# Patient Record
Sex: Female | Born: 1965 | Race: White | Hispanic: No | Marital: Married | Smoking: Never smoker
Health system: Southern US, Community
[De-identification: ages and names within clinical notes are randomized; demographics above are authoritative.]

## PROBLEM LIST (undated history)

## (undated) DIAGNOSIS — K8020 Calculus of gallbladder without cholecystitis without obstruction: Secondary | ICD-10-CM

## (undated) DIAGNOSIS — C8190 Hodgkin lymphoma, unspecified, unspecified site: Secondary | ICD-10-CM

## (undated) DIAGNOSIS — E785 Hyperlipidemia, unspecified: Secondary | ICD-10-CM

## (undated) DIAGNOSIS — K529 Noninfective gastroenteritis and colitis, unspecified: Secondary | ICD-10-CM

## (undated) HISTORY — PX: ABDOMINAL HYSTERECTOMY: SHX81

## (undated) HISTORY — PX: OTHER SURGICAL HISTORY: SHX169

## (undated) HISTORY — DX: Noninfective gastroenteritis and colitis, unspecified: K52.9

## (undated) HISTORY — PX: LYMPH NODE BIOPSY: SHX201

## (undated) HISTORY — DX: Calculus of gallbladder without cholecystitis without obstruction: K80.20

## (undated) HISTORY — DX: Hodgkin lymphoma, unspecified, unspecified site: C81.90

## (undated) HISTORY — DX: Hyperlipidemia, unspecified: E78.5

## (undated) HISTORY — PX: TONSILECTOMY, ADENOIDECTOMY, BILATERAL MYRINGOTOMY AND TUBES: SHX2538

---

## 1997-01-03 DIAGNOSIS — C8190 Hodgkin lymphoma, unspecified, unspecified site: Secondary | ICD-10-CM

## 1997-01-03 HISTORY — DX: Hodgkin lymphoma, unspecified, unspecified site: C81.90

## 1997-01-03 HISTORY — PX: OTHER SURGICAL HISTORY: SHX169

## 1997-05-01 ENCOUNTER — Ambulatory Visit (HOSPITAL_COMMUNITY): Admission: RE | Admit: 1997-05-01 | Discharge: 1997-05-01 | Payer: Self-pay | Admitting: *Deleted

## 1997-05-21 ENCOUNTER — Ambulatory Visit (HOSPITAL_COMMUNITY): Admission: RE | Admit: 1997-05-21 | Discharge: 1997-05-21 | Payer: Self-pay | Admitting: Oncology

## 1997-05-29 ENCOUNTER — Ambulatory Visit (HOSPITAL_COMMUNITY): Admission: RE | Admit: 1997-05-29 | Discharge: 1997-05-29 | Payer: Self-pay | Admitting: Oncology

## 1997-06-03 ENCOUNTER — Ambulatory Visit (HOSPITAL_COMMUNITY): Admission: RE | Admit: 1997-06-03 | Discharge: 1997-06-03 | Payer: Self-pay | Admitting: Oncology

## 1997-09-29 ENCOUNTER — Ambulatory Visit (HOSPITAL_COMMUNITY): Admission: RE | Admit: 1997-09-29 | Discharge: 1997-09-29 | Payer: Self-pay | Admitting: Oncology

## 1997-10-03 ENCOUNTER — Encounter: Payer: Self-pay | Admitting: Oncology

## 1997-10-03 ENCOUNTER — Ambulatory Visit (HOSPITAL_COMMUNITY): Admission: RE | Admit: 1997-10-03 | Discharge: 1997-10-03 | Payer: Self-pay | Admitting: Oncology

## 1997-12-31 ENCOUNTER — Ambulatory Visit (HOSPITAL_BASED_OUTPATIENT_CLINIC_OR_DEPARTMENT_OTHER): Admission: RE | Admit: 1997-12-31 | Discharge: 1997-12-31 | Payer: Self-pay | Admitting: *Deleted

## 1998-10-22 ENCOUNTER — Other Ambulatory Visit: Admission: RE | Admit: 1998-10-22 | Discharge: 1998-10-22 | Payer: Self-pay | Admitting: *Deleted

## 1999-01-04 HISTORY — PX: LASIK: SHX215

## 2000-04-21 ENCOUNTER — Other Ambulatory Visit: Admission: RE | Admit: 2000-04-21 | Discharge: 2000-04-21 | Payer: Self-pay | Admitting: *Deleted

## 2002-04-02 ENCOUNTER — Other Ambulatory Visit: Admission: RE | Admit: 2002-04-02 | Discharge: 2002-04-02 | Payer: Self-pay | Admitting: Obstetrics & Gynecology

## 2003-04-15 ENCOUNTER — Other Ambulatory Visit: Admission: RE | Admit: 2003-04-15 | Discharge: 2003-04-15 | Payer: Self-pay | Admitting: Obstetrics & Gynecology

## 2004-11-03 ENCOUNTER — Ambulatory Visit: Payer: Self-pay | Admitting: Oncology

## 2005-01-03 HISTORY — PX: PARTIAL HYSTERECTOMY: SHX80

## 2005-05-04 ENCOUNTER — Encounter: Payer: Self-pay | Admitting: Oncology

## 2005-10-07 ENCOUNTER — Ambulatory Visit: Payer: Self-pay | Admitting: Oncology

## 2007-01-04 HISTORY — PX: OTHER SURGICAL HISTORY: SHX169

## 2009-01-03 HISTORY — PX: BREAST REDUCTION SURGERY: SHX8

## 2011-01-04 HISTORY — PX: OTHER SURGICAL HISTORY: SHX169

## 2012-10-23 ENCOUNTER — Encounter: Payer: Self-pay | Admitting: Internal Medicine

## 2012-12-10 ENCOUNTER — Encounter: Payer: Self-pay | Admitting: Internal Medicine

## 2012-12-11 ENCOUNTER — Ambulatory Visit (INDEPENDENT_AMBULATORY_CARE_PROVIDER_SITE_OTHER): Payer: BC Managed Care – PPO | Admitting: Internal Medicine

## 2012-12-11 ENCOUNTER — Encounter: Payer: Self-pay | Admitting: Internal Medicine

## 2012-12-11 ENCOUNTER — Other Ambulatory Visit (INDEPENDENT_AMBULATORY_CARE_PROVIDER_SITE_OTHER): Payer: BC Managed Care – PPO

## 2012-12-11 VITALS — BP 100/60 | HR 64 | Ht 62.25 in | Wt 134.4 lb

## 2012-12-11 DIAGNOSIS — C8190 Hodgkin lymphoma, unspecified, unspecified site: Secondary | ICD-10-CM | POA: Insufficient documentation

## 2012-12-11 DIAGNOSIS — R109 Unspecified abdominal pain: Secondary | ICD-10-CM

## 2012-12-11 LAB — COMPREHENSIVE METABOLIC PANEL
ALT: 12 U/L (ref 0–35)
AST: 17 U/L (ref 0–37)
Albumin: 4.4 g/dL (ref 3.5–5.2)
Alkaline Phosphatase: 74 U/L (ref 39–117)
Calcium: 9.7 mg/dL (ref 8.4–10.5)
Chloride: 100 mEq/L (ref 96–112)
Creatinine, Ser: 0.7 mg/dL (ref 0.4–1.2)
GFR: 88.65 mL/min (ref >60.00)
Glucose, Bld: 96 mg/dL (ref 70–99)
Total Bilirubin: 0.8 mg/dL (ref 0.3–1.2)
Total Protein: 8.1 g/dL (ref 6.0–8.3)

## 2012-12-11 LAB — SEDIMENTATION RATE: Sed Rate: 10 mm/hr (ref 0–22)

## 2012-12-11 LAB — CBC
Hemoglobin: 15 g/dL (ref 12.0–15.0)
MCHC: 34.1 g/dL (ref 30.0–36.0)
Platelets: 248 10*3/uL (ref 150.0–400.0)
RDW: 12.5 % (ref 11.5–14.6)

## 2012-12-11 LAB — TSH: TSH: 2.72 u[IU]/mL (ref 0.35–5.50)

## 2012-12-11 MED ORDER — DICYCLOMINE HCL 10 MG PO CAPS: 20.0000 mg | ORAL_CAPSULE | Freq: Three times a day (TID) | ORAL | Status: DC

## 2012-12-11 NOTE — Patient Instructions (Signed)
Your physician has requested that you go to the basement forlab work before leaving today.  We have sent the following medications to your pharmacy for you to pick up at your convenience: Bentyl.  Start taking benefiber 1-2 times daily  You have been scheduled for a colonoscopy/Endoscopy with propofol. Please follow written instructions given to you at your visit today.  Please pick up your prep kit at the pharmacy within the next 1-3 days. If you use inhalers (even only as needed), please bring them with you on the day of your procedure. Your physician has requested that you go to www.startemmi.com and enter the access code given to you at your visit today. This web site gives a general overview about your procedure. However, you should still follow specific instructions given to you by our office regarding your preparation for the procedure.

## 2012-12-11 NOTE — Progress Notes (Signed)
Patient ID: Yolanda [name], female   DOB: 03/16/1965, 47 y.o.   MRN: [id number] HPI: [name] is a 47 yo female with a past medical history of Hodgkin's disease in remission, hyperlipidemia, and left ovarian cyst status post left oophorectomy who is seen for evaluation of chronic left-sided abdominal pain. She is here alone today. Her surgical history is somewhat complex and dates back to Oct 2013 she had a left ovarian cyst removed. This surgery was complicated by a thermal injury to her left ureter requiring several ureteral stent placements and eventually robotic surgery in May 2014 for ureteral repair. She was also told during her initial surgery in October 2013 she had a "kink in her colon" and that it was "fixed" by her gynecologist.  She reports despite having been through all of the previously mentioned surgeries she feels that her left-sided abdominal pain may have been present the entire time even before the large left ovarian cyst was removed. She reports a near constant left-sided abdominal aching pain. This is 4-5/10 in intensity. She reports that it is better when she is distracted and working, but present whenever she is still or not dizzy. This is worse when she is constipated but doesn't get better once she is able to have a bowel movement. She reports a long history of constipation having a bowel movement every 4-5 days but over the last months her stools have been more frequent and more loose. She is now having a loose bowel movement about every other day. She reports this feels like a "expanding pressure" in her left midabdomen. It is also worse with overeating. She denies nausea or vomiting. No heartburn. She occasionally uses 2 over-the-counter naproxen which seems to help some and about once a week uses the Percocet. She reports it hurts to lie on her left side or on her abdomen. She denies blood in her stool or melena. No significant weight loss.  Of note she did see Dr. Tempie Donning at [facility] gynecologic and pelvic pain clinic - and was told that this pain was not necessarily pelvic in nature and that a GYN issue had been ruled out. It was his recommendation for her to have a GI consultation  Past Medical History  Diagnosis Date  . Hodgkin's disease 1999  . Gallstones   . Hyperlipidemia   . Colitis     Past Surgical History  Procedure Laterality Date  . Abdominal hysterectomy    . Tonsilectomy, adenoidectomy, bilateral myringotomy and tubes    . Lubualigation    . Lymph node biopsy    . Port placed  1999  . Port removed  1999  . Lasik  2001  . Partial hysterectomy  2007  . Tummy tuck  2009  . Incision revision  2009  . Breast reduction surgery  2011  . Left ovary /fallopian removed  2013    Current Outpatient Prescriptions  Medication Sig Dispense Refill  . Naproxen Sodium (ALEVE) 220 MG CAPS Take 2 capsules by mouth every morning.      Marland Kitchen oxyCODONE-acetaminophen (PERCOCET/ROXICET) 5-325 MG per tablet Take 1 tablet by mouth as needed for severe pain.      Marland Kitchen dicyclomine (BENTYL) 10 MG capsule Take 2 capsules (20 mg total) by mouth 4 (four) times daily -  before meals and at bedtime.  90 capsule  1   No current facility-administered medications for this visit.    Allergies  Allergen Reactions  . Codeine Itching  . Sulfa Antibiotics  Hives    Family History  Problem Relation Age of Onset  . Diabetes Father   . Heart disease Father   . Heart disease Mother   . Colon cancer Neg Hx     History  Substance Use Topics  . Smoking status: Never Smoker   . Smokeless tobacco: Never Used  . Alcohol Use: Yes    ROS: As per history of present illness, otherwise negative  BP 100/60  Pulse 64  Ht 5' 2.25" (1.581 m)  Wt 134 lb 6.4 oz (60.963 kg)  BMI 24.39 kg/m2 Constitutional: Well-developed and well-nourished. No distress. HEENT: Normocephalic and atraumatic. Oropharynx is clear and moist. No oropharyngeal exudate. Conjunctivae are normal.  No  scleral icterus. Neck: Neck supple. Trachea midline. Cardiovascular: Normal rate, regular rhythm and intact distal pulses. No M/R/G Pulmonary/chest: Effort normal and breath sounds normal. No wheezing, rales or rhonchi. Abdominal: Multiple well-healed abdominal scars, Soft, nontender, nondistended. Bowel sounds active throughout. There are no masses palpable.  Extremities: no clubbing, cyanosis, or edema Lymphadenopathy: No cervical adenopathy noted. Neurological: Alert and oriented to person place and time. Skin: Skin is warm and dry. No rashes noted. Psychiatric: Normal mood and affect. Behavior is normal.  ASSESSMENT/PLAN: 47 yo female with a past medical history of Hodgkin's disease in remission, hyperlipidemia, and left ovarian cyst status post left oophorectomy who is seen for evaluation of chronic left-sided abdominal pain.   1.  Chronic, left-sided abd pain -- she has had ovarian surgery complicated by ureteral injury and multiple urologic interventions over the last year, though none of them have led to significant improvement in her now chronic left-sided abdominal pain. My initial inclination is that this may be functional in nature, though I feel it is important to rule out anatomic abnormalities. I have recommended colonoscopy for direct visualization of the colon. This will also help rule out an inflammatory process such as IBD. We will also perform an upper endoscopy on the same day to rule out ulcer disease or significant upper GI inflammation.  We entertained the possibility of an internal hernia given her multiple previous abdominal surgeries, though these are sometimes hard to prove. We will initially start with endoscopies and consider small bowel follow-through thereafter if deemed appropriate. I will also check CMP, CBC, celiac panel, TSH and ESR today. I recommended that she begin Benefiber 1-2 times daily to help bulk her stools and also establish more regular bowel pattern. I  will give her a prescription for Bentyl 20 mg to be used 3 times daily to see if this helps with her discomfort. She has had cross-sectional imaging in the past, though no recent CT is available in our system.  Further recommendations after labs and procedures

## 2012-12-12 ENCOUNTER — Encounter: Payer: Self-pay | Admitting: Internal Medicine

## 2012-12-17 ENCOUNTER — Telehealth: Payer: Self-pay | Admitting: Internal Medicine

## 2012-12-18 MED ORDER — MOVIPREP 100 G PO SOLR: ORAL | Status: DC

## 2012-12-18 NOTE — Telephone Encounter (Signed)
Sent moviprep to pt's pharamcy

## 2013-01-09 ENCOUNTER — Ambulatory Visit (AMBULATORY_SURGERY_CENTER): Payer: BC Managed Care – PPO | Admitting: Internal Medicine

## 2013-01-09 ENCOUNTER — Encounter: Payer: Self-pay | Admitting: Internal Medicine

## 2013-01-09 ENCOUNTER — Ambulatory Visit: Payer: Self-pay | Admitting: Internal Medicine

## 2013-01-09 VITALS — BP 117/69 | HR 68 | Temp 97.9°F | Resp 13 | Ht 62.0 in | Wt 134.0 lb

## 2013-01-09 DIAGNOSIS — D126 Benign neoplasm of colon, unspecified: Secondary | ICD-10-CM

## 2013-01-09 DIAGNOSIS — R109 Unspecified abdominal pain: Secondary | ICD-10-CM

## 2013-01-09 DIAGNOSIS — K209 Esophagitis, unspecified without bleeding: Secondary | ICD-10-CM

## 2013-01-09 DIAGNOSIS — K229 Disease of esophagus, unspecified: Secondary | ICD-10-CM

## 2013-01-09 MED ORDER — SODIUM CHLORIDE 0.9 % IV SOLN: 500.0000 mL | INTRAVENOUS | Status: DC

## 2013-01-09 MED ORDER — TRAMADOL HCL 50 MG PO TABS: 50.0000 mg | ORAL_TABLET | Freq: Four times a day (QID) | ORAL | Status: DC | PRN

## 2013-01-09 NOTE — Patient Instructions (Signed)
Impressions/recommendations:  Endoscopy  Await biopsy results.  Colonoscopy  Polyp (handout given) Diverticulosis (handout given)  YOU HAD AN ENDOSCOPIC PROCEDURE TODAY AT [facility]: Refer to the procedure report that was given to you for any specific questions about what was found during the examination.  If the procedure report does not answer your questions, please call your gastroenterologist to clarify.  If you requested that your care partner not be given the details of your procedure findings, then the procedure report has been included in a sealed envelope for you to review at your convenience later.  YOU SHOULD EXPECT: Some feelings of bloating in the abdomen. Passage of more gas than usual.  Walking can help get rid of the air that was put into your GI tract during the procedure and reduce the bloating. If you had a lower endoscopy (such as a colonoscopy or flexible sigmoidoscopy) you may notice spotting of blood in your stool or on the toilet paper. If you underwent a bowel prep for your procedure, then you may not have a normal bowel movement for a few days.  DIET: Your first meal following the procedure should be a light meal and then it is ok to progress to your normal diet.  A half-sandwich or bowl of soup is an example of a good first meal.  Heavy or fried foods are harder to digest and may make you feel nauseous or bloated.  Likewise meals heavy in dairy and vegetables can cause extra gas to form and this can also increase the bloating.  Drink plenty of fluids but you should avoid alcoholic beverages for 24 hours.  ACTIVITY: Your care partner should take you home directly after the procedure.  You should plan to take it easy, moving slowly for the rest of the day.  You can resume normal activity the day after the procedure however you should NOT DRIVE or use heavy machinery for 24 hours (because of the sedation medicines used during the test).    SYMPTOMS TO  REPORT IMMEDIATELY: A gastroenterologist can be reached at any hour.  During normal business hours, 8:30 AM to 5:00 PM Monday through Friday, call [phone].  After hours and on weekends, please call the GI answering service at [phone] who will take a message and have the physician on call contact you.   Following lower endoscopy (colonoscopy or flexible sigmoidoscopy):  Excessive amounts of blood in the stool  Significant tenderness or worsening of abdominal pains  Swelling of the abdomen that is new, acute  Fever of 100F or higher  Following upper endoscopy (EGD)  Vomiting of blood or coffee ground material  New chest pain or pain under the shoulder blades  Painful or persistently difficult swallowing  New shortness of breath  Fever of 100F or higher  Black, tarry-looking stools  FOLLOW UP: If any biopsies were taken you will be contacted by phone or by letter within the next 1-3 weeks.  Call your gastroenterologist if you have not heard about the biopsies in 3 weeks.  Our staff will call the home number listed on your records the next business day following your procedure to check on you and address any questions or concerns that you may have at that time regarding the information given to you following your procedure. This is a courtesy call and so if there is no answer at the home number and we have not heard from you through the emergency physician on call, we will assume  that you have returned to your regular daily activities without incident.  SIGNATURES/CONFIDENTIALITY: You and/or your care partner have signed paperwork which will be entered into your electronic medical record.  These signatures attest to the fact that that the information above on your After Visit Summary has been reviewed and is understood.  Full responsibility of the confidentiality of this discharge information lies with you and/or your care-partner.

## 2013-01-09 NOTE — Progress Notes (Signed)
Called to room to assist during endoscopic procedure.  Patient ID and intended procedure confirmed with present staff. Received instructions for my participation in the procedure from the performing physician.  

## 2013-01-09 NOTE — Progress Notes (Signed)
Lidocaine-40mg IV prior to Propofol InductionPropofol given over incremental dosages 

## 2013-01-09 NOTE — Op Note (Signed)
[place]  [address] [place], [address]   ENDOSCOPY PROCEDURE REPORT  PATIENT: Yolanda, [name]  MR#: [id number] BIRTHDATE: 07-Oct-1965 , 47  yrs. old GENDER: Female ENDOSCOPIST: Jerene Bears, MD PROCEDURE DATE:  01/09/2013 PROCEDURE:  EGD w/ biopsy ASA CLASS:     Class II INDICATIONS:  left-sided abdominal pain. MEDICATIONS: MAC sedation, administered by CRNA and propofol (Diprivan) 200mg  IV TOPICAL ANESTHETIC: Cetacaine Spray  DESCRIPTION OF PROCEDURE: After the risks benefits and alternatives of the procedure were thoroughly explained, informed consent was obtained.  The LB [id number] endoscope was introduced through the mouth and advanced to the second portion of the duodenum. Without limitations.  The instrument was slowly withdrawn as the mucosa was fully examined.    ESOPHAGUS: There was a 1 cm island of possible Barrett's esophagus in the lower third of the esophagus, at 38 cm.  Multiple biopsies were performed using cold forceps.  Sample obtained to rule out Barrett's esophagus (If positive, C0M1).   The esophagus was otherwise normal.  STOMACH: The mucosa of the stomach appeared normal.  DUODENUM: The duodenal mucosa showed no abnormalities in the bulb and second portion of the duodenum. Retroflexed views revealed no abnormalities.     The scope was then withdrawn from the patient and the procedure completed.  COMPLICATIONS: There were no complications. ENDOSCOPIC IMPRESSION: 1.   There was evidence of possible Barrett's esophagus; multiple biopsies 2.   The esophagus was otherwise normal. 3.   The mucosa of the stomach appeared normal 4.   The duodenal mucosa showed no abnormalities in the bulb and second portion of the duodenum  RECOMMENDATIONS: 1.  Await pathology results 2.  Colonoscopy today   eSigned:  Jerene Bears, MD 01/09/2013 4:27 PM CC:The Patient and Cecille Amsterdam, MD

## 2013-01-09 NOTE — Op Note (Signed)
[place]  [address]. [place], [address]   COLONOSCOPY PROCEDURE REPORT  PATIENT: Yolanda [name], Yolanda [name]  MR#: [id number] BIRTHDATE: April 24, 1965 , 47  yrs. old GENDER: Female ENDOSCOPIST: Jerene Bears, MD PROCEDURE DATE:  01/09/2013 PROCEDURE:   Colonoscopy with snare polypectomy First Screening Colonoscopy - Avg.  risk and is 50 yrs.  old or older - No.  Prior Negative Screening - Now for repeat screening. N/A  History of Adenoma - Now for follow-up colonoscopy & has been > or = to 3 yrs.  N/A  Polyps Removed Today? Yes. ASA CLASS:   Class II INDICATIONS:chronic left-sided abdominal pain, screening. MEDICATIONS: MAC sedation, administered by CRNA and propofol (Diprivan) 200mg  IV  DESCRIPTION OF PROCEDURE:   After the risks benefits and alternatives of the procedure were thoroughly explained, informed consent was obtained.  A digital rectal exam revealed no rectal mass.   The LB [id number]  endoscope was introduced through the anus and advanced to the cecum, which was identified by both the appendix and ileocecal valve. No adverse events experienced. The quality of the prep was good, using MoviPrep  The instrument was then slowly withdrawn as the colon was fully examined.   COLON FINDINGS: A sessile polyp measuring 4 mm in size was found at the hepatic flexure.  A polypectomy was performed with a cold snare.  The resection was complete and the polyp tissue was completely retrieved.   There was mild scattered diverticulosis noted in the ascending colon and sigmoid colon.   The colon mucosa was otherwise normal.  Retroflexed views revealed no abnormalities. The time to cecum=4 minutes 03 seconds.  Withdrawal time=12 minutes 00 seconds.  The scope was withdrawn and the procedure completed. COMPLICATIONS: There were no complications.  ENDOSCOPIC IMPRESSION: 1.   Sessile polyp measuring 4 mm in size was found at the hepatic flexure; polypectomy was performed  with a cold snare 2.   There was mild diverticulosis noted in the ascending colon and sigmoid colon 3.   The colon mucosa was otherwise normal without explanation of left-sided abdominal pain  RECOMMENDATIONS: 1.  Await pathology results 2.  High fiber diet 3.  If the polyp removed today is proven to be an adenomatous (pre-cancerous) polyp, you will need a repeat colonoscopy in 5 years.  Otherwise you should continue to follow colorectal cancer screening guidelines for "routine risk" patients with colonoscopy in 10 years.  You will receive a letter within 1-2 weeks with the results of your biopsy as well as final recommendations.  Please call my office if you have not received a letter after 3 weeks.   eSigned:  Jerene Bears, MD 01/09/2013 4:31 PM cc: The Patient and Cecille Amsterdam, MD

## 2013-01-10 ENCOUNTER — Telehealth: Payer: Self-pay | Admitting: *Deleted

## 2013-01-10 NOTE — Telephone Encounter (Signed)
  Follow up Call-  Call back number 01/09/2013  Post procedure Call Back phone  # [phone]  Permission to leave phone message Yes     Patient questions:  Do you have a fever, pain , or abdominal swelling? no Pain Score  0 *  Have you tolerated food without any problems? yes  Have you been able to return to your normal activities? yes  Do you have any questions about your discharge instructions: Diet   no Medications  no Follow up visit  no  Do you have questions or concerns about your Care? no  Actions: * If pain score is 4 or above: No action needed, pain <4.

## 2013-01-11 ENCOUNTER — Telehealth: Payer: Self-pay | Admitting: *Deleted

## 2013-01-11 DIAGNOSIS — Z90710 Acquired absence of both cervix and uterus: Secondary | ICD-10-CM

## 2013-01-11 DIAGNOSIS — G8929 Other chronic pain: Secondary | ICD-10-CM

## 2013-01-11 DIAGNOSIS — N9981 Other intraoperative complications of genitourinary system: Secondary | ICD-10-CM

## 2013-01-11 DIAGNOSIS — R1032 Left lower quadrant pain: Principal | ICD-10-CM

## 2013-01-11 NOTE — Telephone Encounter (Signed)
Pt will have her CT on 01/14/13 at 10am. Contrast and  instructions are at the front desk. Pt stated understanding.

## 2013-01-11 NOTE — Telephone Encounter (Signed)
Message copied by Lance Morin on Fri Jan 11, 2013  8:43 AM ------      Message from: Jerene Bears      Created: Wed Jan 09, 2013  5:08 PM      Regarding: Radiology request       CT abd/pelvis with contrast      Chronic left-sided abd pain, hx of hysterectomy with left ureteral injury, s/p repair      JMP       ------

## 2013-01-14 ENCOUNTER — Ambulatory Visit (INDEPENDENT_AMBULATORY_CARE_PROVIDER_SITE_OTHER)
Admission: RE | Admit: 2013-01-14 | Discharge: 2013-01-14 | Disposition: A | Discharge status: Home / Self Care | Payer: BC Managed Care – PPO | Source: Ambulatory Visit | Attending: Internal Medicine | Admitting: Internal Medicine

## 2013-01-14 DIAGNOSIS — G8929 Other chronic pain: Secondary | ICD-10-CM

## 2013-01-14 DIAGNOSIS — Z90710 Acquired absence of both cervix and uterus: Secondary | ICD-10-CM

## 2013-01-14 DIAGNOSIS — R1032 Left lower quadrant pain: Secondary | ICD-10-CM

## 2013-01-14 DIAGNOSIS — N9981 Other intraoperative complications of genitourinary system: Secondary | ICD-10-CM

## 2013-01-14 MED ORDER — IOHEXOL 300 MG/ML  SOLN
100.0000 mL | Freq: Once | INTRAMUSCULAR | Status: AC | PRN
  Administered 2013-01-14: 100 mL via INTRAVENOUS

## 2013-01-15 ENCOUNTER — Other Ambulatory Visit: Payer: BC Managed Care – PPO

## 2013-01-16 ENCOUNTER — Encounter: Payer: Self-pay | Admitting: Internal Medicine

## 2013-01-17 ENCOUNTER — Telehealth: Payer: Self-pay | Admitting: Internal Medicine

## 2013-01-17 NOTE — Telephone Encounter (Signed)
After several attempts at several numbers, the scheduling number for Dr Darlys Gales is [phone]. The fax number is [phone]. The ofc requested I fax the info and Dr Peterson Ao will decide if pt should see him or another specialist at his facility. lmom informed pt of this; she may call back.

## 2013-01-18 NOTE — Telephone Encounter (Signed)
She should see me after OBGYN

## 2013-01-18 NOTE — Telephone Encounter (Signed)
Informed pt I faxed over her ofc info and Dr Peterson Ao will review and his ofc will call her back. Pt states they have left a message she wanted to know what I found out. Wished her luck.

## 2013-01-18 NOTE — Telephone Encounter (Signed)
lmom for pt to call back

## 2013-01-21 NOTE — Telephone Encounter (Signed)
Mailed pt a letter requesting she call for a f/u after she is seen at [facility].

## 2013-02-05 ENCOUNTER — Telehealth: Payer: Self-pay | Admitting: Internal Medicine

## 2013-02-05 DIAGNOSIS — R1032 Left lower quadrant pain: Secondary | ICD-10-CM

## 2013-02-05 NOTE — Telephone Encounter (Signed)
Pt called earlier and left me a message; she saw Dr Tommye Standard at [facility]  And he reviewed her CT scan, and her problem is not GYN related. She has a cervix and that's what was seen on the CT. The doctor did do a pelvic exam that was normal and her cervix was checked also. The doctor could not think of anyone that he could refer her to. Pt would like to have an MRI if you will order it. The earliest f/u I could give her is 03/05/13. Please advise. Thanks.

## 2013-02-11 NOTE — Telephone Encounter (Signed)
Pt called in to see if Dr Hilarie Fredrickson decided on an MRI or not.

## 2013-02-14 NOTE — Telephone Encounter (Signed)
lmom for pt to call back

## 2013-02-14 NOTE — Telephone Encounter (Signed)
Okay for pelvic MRI to followup previous CT scan abnormality

## 2013-02-14 NOTE — Telephone Encounter (Signed)
Pt stated she was willing to have an MRI anytime. She is scheduled for 02/16/14, arrive at 0800am at [address], no other prep. Pt stated understanding.

## 2013-02-16 ENCOUNTER — Ambulatory Visit
Admission: RE | Admit: 2013-02-16 | Discharge: 2013-02-16 | Disposition: A | Discharge status: Home / Self Care | Payer: BC Managed Care – PPO | Source: Ambulatory Visit | Attending: Internal Medicine | Admitting: Internal Medicine

## 2013-02-16 DIAGNOSIS — R1032 Left lower quadrant pain: Secondary | ICD-10-CM

## 2013-02-16 MED ORDER — GADOBENATE DIMEGLUMINE 529 MG/ML IV SOLN
12.0000 mL | Freq: Once | INTRAVENOUS | Status: AC | PRN
  Administered 2013-02-16: 12 mL via INTRAVENOUS

## 2013-02-21 ENCOUNTER — Telehealth: Payer: Self-pay | Admitting: Internal Medicine

## 2013-02-21 NOTE — Telephone Encounter (Signed)
Please advise on results of MRI and next step

## 2013-02-21 NOTE — Telephone Encounter (Signed)
See result note.  

## 2013-02-21 NOTE — Telephone Encounter (Signed)
See results note for documentation on MRI

## 2013-03-04 ENCOUNTER — Encounter: Payer: Self-pay | Admitting: Internal Medicine

## 2013-03-05 ENCOUNTER — Ambulatory Visit (INDEPENDENT_AMBULATORY_CARE_PROVIDER_SITE_OTHER): Payer: BC Managed Care – PPO | Admitting: Internal Medicine

## 2013-03-05 ENCOUNTER — Encounter: Payer: Self-pay | Admitting: Internal Medicine

## 2013-03-05 VITALS — BP 108/66 | HR 68 | Ht 62.75 in | Wt 134.2 lb

## 2013-03-05 DIAGNOSIS — R1032 Left lower quadrant pain: Secondary | ICD-10-CM

## 2013-03-05 DIAGNOSIS — G8929 Other chronic pain: Secondary | ICD-10-CM

## 2013-03-05 MED ORDER — AMITRIPTYLINE HCL 25 MG PO TABS: 25.0000 mg | ORAL_TABLET | Freq: Every day | ORAL | Status: DC

## 2013-03-05 NOTE — Patient Instructions (Signed)
We have sent the following medications to your pharmacy for you to pick up at your convenience: amitriptyline   You have been referred to [facility] located at [place], [place] [phone] they will contact you to set up an appointment.   Follow up with your urologist and please have him fax [place] any office visit notes.

## 2013-03-05 NOTE — Progress Notes (Signed)
Subjective:    Patient ID: Yolanda [name], female    DOB: 09/16/1965, 48 y.o.   MRN: [id number]  HPI Yolanda [name] is a 48 year old female with a past medical history of Hodgkin's disease in remission, hyperlipidemia, left ovarian cyst status post oophorectomy complicated by left ureteral injury requiring several urologic surgeries who is seen in followup for chronic left-sided abdominal pain. She is here alone today. She was initially seen in December 2014 and then had an upper endoscopy and colonoscopy performed on 01/09/2013. Her colonoscopy showed a 4 mm sessile polyp at the hepatic flexure. This was removed and found to not be adenomatous. There was mild diverticulosis in the ascending and sigmoid colon the colonic mucosa was otherwise normal. No findings to explain left-sided abdominal pain. Upper endoscopy performed on the same day showed possible Barrett's esophagus and was otherwise normal. Biopsies were negative for metaplasia and did show mild inflammation felt to be secondary to reflux. No dysplasia. After her endoscopy she had a CT scan and MRI pelvis and has followed up with gynecology at [facility]. She was found to have right ovarian cyst felt to be benign and the CT abnormality near the vaginal cuff was confirmed by pelvic MRI to be cervix which was not removed at the time of her hysterectomy. Her gynecologist did not feel her left-sided abdominal pain was gynecologic in etiology.  The pain is unchanged there may be a bit worse than when it first started. This feels similar to the pain she had before her oophorectomy in October 2013. It is constant in nature and unchanging though it does worsen just before bowel movement. It is located in her left abdomen. There is a distractibility component in that it is less noticeable when she is working. Otherwise it is constant around 5-6/10. It does not change with eating and is not improve after bowel movement. She denies diarrhea or  constipation, no blood in her stool or melena.  She is hesitant for chronic pain clinic because she feels this has a stigma associated with it. She does plan to followup with Dr. Estill Dooms her urologist in [facility] repeat IVP study  Review of Systems As per history of present illness, otherwise negative  Current Medications, Allergies, Past Medical History, Past Surgical History, Family History and Social History were reviewed in [employer] record.     Objective:   Physical Exam BP 108/66  Pulse 68  Ht 5' 2.75" (1.594 m)  Wt 134 lb 3.2 oz (60.873 kg)  BMI 23.96 kg/m2 Constitutional: Well-developed and well-nourished. No distress. HEENT: Normocephalic and atraumatic.  No scleral icterus. Cardiovascular: Normal rate, regular rhythm  Pulmonary/chest: Effort normal and breath sounds normal.  Abdominal: Soft, nontender, nondistended. Bowel sounds active throughout. There are no masses palpable.  Extremities: no clubbing, cyanosis, or edema Neurological: Alert and oriented to person place and time. Psychiatric: Normal mood and affect. Behavior is normal.  CBC    Component Value Date/Time   WBC 6.2 12/11/2012 1117   RBC 4.82 12/11/2012 1117   HGB 15.0 12/11/2012 1117   HCT 44.1 12/11/2012 1117   PLT 248.0 12/11/2012 1117   MCV 91.5 12/11/2012 1117   MCHC 34.1 12/11/2012 1117   RDW 12.5 12/11/2012 1117    CMP     Component Value Date/Time   NA 135 12/11/2012 1117   K 4.2 12/11/2012 1117   CL 100 12/11/2012 1117   CO2 27 12/11/2012 1117   GLUCOSE 96 12/11/2012 1117  BUN 15 12/11/2012 1117   CREATININE 0.7 12/11/2012 1117   CALCIUM 9.7 12/11/2012 1117   PROT 8.1 12/11/2012 1117   ALBUMIN 4.4 12/11/2012 1117   AST 17 12/11/2012 1117   ALT 12 12/11/2012 1117   ALKPHOS 74 12/11/2012 1117   BILITOT 0.8 12/11/2012 1117   IMAGING 2015  CT ABDOMEN AND PELVIS WITH CONTRAST   TECHNIQUE: Multidetector CT imaging of the abdomen and pelvis was performed using the standard  protocol following bolus administration of intravenous contrast.   CONTRAST:  130mL OMNIPAQUE IOHEXOL 300 MG/ML  SOLN   COMPARISON:  CT of the abdomen and pelvis 10/22/2011.   FINDINGS: Lung Bases: Unremarkable.   Abdomen/Pelvis: Several noncalcified gallstones are noted in the gallbladder. No current findings to suggest acute cholecystitis at this time. The appearance of the liver, pancreas, spleen, bilateral adrenal glands and bilateral kidneys is unremarkable.   No significant volume of ascites. No pneumoperitoneum. No pathologic distention of small bowel. Normal appendix. No definite lymphadenopathy. The patient is status post hysterectomy and left oophorectomy. The right ovary is remarkable for a 2.5 x 1.9 x 2.3 cm low to intermediate attenuation lesion, presumably an ovarian cyst. The vaginal cuff is unusual in appearance. Specifically, there is asymmetric soft tissue prominence on the left side of the vaginal cuff, best demonstrated on images 72 of series 2, 71 of series 602, and 86 of series 603. In this area there is heterogeneous enhancement and some central low-attenuation regions which are irregular in shape, favored to represent areas of cystic change. No significant volume of ascites. No pneumoperitoneum. No pathologic distention of small bowel.   Musculoskeletal: There are no aggressive appearing lytic or blastic lesions noted in the visualized portions of the skeleton. Advanced degenerative disc disease at L4-L5 where there is 4 mm of retrolisthesis of L4 upon L5.   IMPRESSION: 1. No definite acute findings in the abdomen or pelvis to account for the patient's symptoms. 2. However, there is an unusual appearance of the apex of the vagina in the region of the vaginal cuff where there is soft tissue fullness, heterogeneous enhancement and apparent cystic changes. These findings are somewhat similar to remote prior study 10/22/2011, and are therefore favored to  be benign, however, there poorly characterized on today's CT examination. The possibility of a chronic infection with draining sinus tract into the vagina and is not excluded. In addition, there is a 2.5 x 1.9 x 2.3 cm low to intermediate attenuation right adnexal lesion which is likely an ovarian cyst. These findings could be better demonstrated by followup pelvic MRI with and without IV gadolinium if clinically indicated. 3. Cholelithiasis without evidence to suggest acute cholecystitis at this time.     MRI PELVIS WITHOUT AND WITH CONTRAST   TECHNIQUE: Multiplanar multisequence MR imaging of the pelvis was performed both before and after administration of intravenous contrast.   CONTRAST:  75mL MULTIHANCE GADOBENATE DIMEGLUMINE 529 MG/ML IV SOLN   COMPARISON:  CT ABD/PELVIS W CM dated 01/14/2013   FINDINGS: The patient is status post supracervical hysterectomy. Normal-appearing cervical remnant is identified with nabothian cysts, for example image 16 series 3. This corresponds to the questioned mass on the prior exam. Soft tissue fullness in the right lateral pelvis most likely reflects ovarian remnant tissue with a few small follicles but overall decrease in size since the prior exam suggesting partial involution of previously seen probable follicles. The bladder is decompressed. Left ovary not identified but no left adnexal mass is seen. No  lymphadenopathy or free fluid. No abnormal enhancement after administration of contrast. Normal appendix reidentified.   IMPRESSION: Normal appearance status post supracervical hysterectomy with ovarian remnant tissue along the right lateral pelvic sidewall and interval follicle involution.   No acute abnormality to explain the history of left lower quadrant pelvic pain.       Assessment & Plan:  48 year old female with a past medical history of Hodgkin's disease in remission, hyperlipidemia, left ovarian cyst status post  oophorectomy complicated by left ureteral injury requiring several urologic surgeries who is seen in followup for chronic left-sided abdominal pain.  1.  Chronic/constant left sided abd pain -- her pain is certainly chronic to this point and after a thorough workup I do not feel it is colonic or GI in origin. The constant nature is not consistent with irritable bowel type pain. She is hesitant to undergo chronic pain management and does not want to take chronic narcotics. It is possible that she has developed adhesive disease after her multiple abdominal surgeries, but again adhesive disease is generally not constant. We discussed possible surgical referral for consideration of laparoscopy, with the knowledge that any additional surgery could lead to further scar tissue. It is also possible that her pain is ureteral in origin and asked that she discuss this with her urologist in followup as planned. I have recommended a trial of amitriptyline 25 mg each bedtime for chronic pain. We discussed the side effect of sleepiness and dry mouth. Finally I recommended that she consider acupuncture for pain relief. She is very open to this idea and will see Agricultural consultant.  Followup after the above consultations/appointments.

## 2014-07-04 ENCOUNTER — Other Ambulatory Visit: Payer: Self-pay | Admitting: Obstetrics and Gynecology

## 2014-07-08 LAB — CYTOLOGY - PAP

## 2014-08-14 ENCOUNTER — Other Ambulatory Visit: Payer: Self-pay | Admitting: Obstetrics and Gynecology

## 2015-04-10 ENCOUNTER — Ambulatory Visit: Payer: Self-pay | Admitting: Allergy and Immunology

## 2015-12-15 ENCOUNTER — Ambulatory Visit (INDEPENDENT_AMBULATORY_CARE_PROVIDER_SITE_OTHER): Payer: BLUE CROSS/BLUE SHIELD | Admitting: Physician Assistant

## 2015-12-15 ENCOUNTER — Encounter: Payer: Self-pay | Admitting: Physician Assistant

## 2015-12-15 VITALS — BP 120/74 | HR 72 | Ht 62.0 in | Wt 145.6 lb

## 2015-12-15 DIAGNOSIS — R1032 Left lower quadrant pain: Secondary | ICD-10-CM | POA: Diagnosis not present

## 2015-12-15 DIAGNOSIS — R1310 Dysphagia, unspecified: Secondary | ICD-10-CM | POA: Diagnosis not present

## 2015-12-15 DIAGNOSIS — G8929 Other chronic pain: Secondary | ICD-10-CM | POA: Diagnosis not present

## 2015-12-15 NOTE — Progress Notes (Addendum)
Subjective:    Patient ID: Yolanda [name], female    DOB: 10/20/1965, 50 y.o.   MRN: [id number]  HPI [name] is a pleasant 50 year old white female known to Dr. Hilarie Fredrickson he was last seen in our office in 2015. She has history of Hodgkin's lymphoma is been in remission and had a left ureteral injury after a left ovarian cystectomy and has required several surgeries. She has developed chronic pain in her left lower quadrant. Patient had undergone colonoscopy in January 2015 which showed a 4 mm polyp which was removed, this was not adenomatous, she also has mild diverticulosis. Also had EGD in January 2015, no stricture noted, there was question of Barrett's-biopsies were negative. Patient comes in today stating that she has been having some intermittent dysphagia over the past 3 years. She says she has about 1 occurrence per week and that this always happens with solid food. She has no liquid dysphagia. She sees these episodes generally are associated with the sensation of food being stuck. It generally requires a lot of effort at regurgitation and usually the food will not go on down but has to be brought back up. She says it's quite uncomfortable. In between these episodes she is not having any chest discomfort or odynophagia. She have any regular heartburn or indigestion. She also wanted to discuss her chronic left lower quadrant pain. She had been offered amitriptyline in the past which she decided not to take because she did not want to gain weight. She had discussed Lyrica with her PCP but decided she did not want to use Lyrica because she enjoys drinking wine. She also tried acupuncture which did not help. She says the pain never completely goes away, she does have good days where the pain is a 2 or 3 but has had some days recently were the pain has been more significant. Says sometimes she has more discomfort if she has constipation or diarrhea but bowel movements do not seem to improve the  discomfort. Pain is also somewhat positional and she feels uncomfortable generally lying on her left side and better if she lies on her right side.  Review of Systems Pertinent positive and negative review of systems were noted in the above HPI section.  All other review of systems was otherwise negative.  Outpatient Encounter Prescriptions as of 12/15/2015  Medication Sig  . levothyroxine (SYNTHROID, LEVOTHROID) 50 MCG tablet Take 1 tablet by mouth daily.  . [DISCONTINUED] Acetaminophen (TYLENOL EXTRA STRENGTH PO) Take 1 capsule by mouth as needed.  . [DISCONTINUED] amitriptyline (ELAVIL) 25 MG tablet Take 1 tablet (25 mg total) by mouth at bedtime.  . [DISCONTINUED] Naproxen Sodium (ALEVE) 220 MG CAPS Take 2 capsules by mouth every morning.  . [DISCONTINUED] oxyCODONE-acetaminophen (PERCOCET/ROXICET) 5-325 MG per tablet Take 1 tablet by mouth as needed for severe pain.  . [DISCONTINUED] traMADol (ULTRAM) 50 MG tablet Take 1 tablet (50 mg total) by mouth every 6 (six) hours as needed.   No facility-administered encounter medications on file as of 12/15/2015.    Allergies  Allergen Reactions  . Codeine Itching  . Sulfa Antibiotics Hives   Patient Active Problem List   Diagnosis Date Noted  . Abdominal pain, chronic, left lower quadrant 03/05/2013  . Left sided abdominal pain 12/11/2012  . Hodgkin's disease in remission ([place]) 12/11/2012   Social History   Social History  . Marital status: Married    Spouse name: N/A  . Number of children: 1  . Years of  education: N/A   Occupational History  . dental assistant    Social History Main Topics  . Smoking status: Never Smoker  . Smokeless tobacco: Never Used  . Alcohol use Yes  . Drug use: No  . Sexual activity: Not on file   Other Topics Concern  . Not on file   Social History Narrative  . No narrative on file    Ms. Floyd's family history includes Diabetes in her father; Heart disease in her father and mother.        Objective:    Vitals:   12/15/15 0852  BP: 120/74  Pulse: 72    Physical Exam  well-developed white female in no acute distress, pleasant blood pressure 120/74 pulse 72 height 5 foot 2 weight 145 BMI 26.6. HEENT; nontraumatic normocephalic EOMI PERRLA sclera anicteric Cardiovascular; regular rate and rhythm with S1-S2 no murmur or gallop, Pulmonary; clear bilaterally, Abdomen; soft basically nontender there is no palpable mass or hepatosplenomegaly bowel sounds are present, Rectal ;exam not done, Ext; no clubbing cyanosis or edema skin warm and dry, Neuropsych; mood and affect appropriate       Assessment & Plan:   #69 50 year old female with intermittent solid food dysphagia 3 years, no stricture identified at EGD January 2015. Rule out esophageal spasm, rule out Schatzki's ring, rule out possible esophageal stricture #2 chronic left lower quadrant pain  secondary to adhesive disease in setting of previous left ureteral injury, left ovarian cystectomy and several subsequent surgeries.  Plan; Schedule for barium swallow with a tablet. She may need EGD with esophageal dilation depending on findings. Offered referral to a pain management specialist which she is not interested in currently. Her pain does not appear to be musculoskeletal, she wishes to avoid pain medications and has been reluctant to try amitriptyline or Lyrica to date. She will try  moist heat and lidocaine or Salonpas patches to see if this offers any benefit.   Tennie Grussing S Natahlia Hoggard PA-C 12/15/2015   Cc: Enid Skeens., MD  Addendum: Reviewed and agree with  management. Jerene Bears, MD

## 2015-12-15 NOTE — Patient Instructions (Signed)
You have been scheduled for a Barium Esophogram at [facility] (1st floor of the hospital) on Friday 12- 15 at 11:00 am. Please arrive  At 10;45 am to your appointment for registration. Make certain not to have anything to eat or drink 3 hours prior to your test. If you need to reschedule for any reason, please contact radiology at [phone] to do so.  Try moist heat to LLQ area.  Try Salon Pas or Lidocaine getl to LLQ for pain relief. __________________________________________________________________ A barium swallow is an examination that concentrates on views of the esophagus. This tends to be a double contrast exam (barium and two liquids which, when combined, create a gas to distend the wall of the oesophagus) or single contrast (non-ionic iodine based). The study is usually tailored to your symptoms so a good history is essential. Attention is paid during the study to the form, structure and configuration of the esophagus, looking for functional disorders (such as aspiration, dysphagia, achalasia, motility and reflux) EXAMINATION You may be asked to change into a gown, depending on the type of swallow being performed. A radiologist and radiographer will perform the procedure. The radiologist will advise you of the type of contrast selected for your procedure and direct you during the exam. You will be asked to stand, sit or lie in several different positions and to hold a small amount of fluid in your mouth before being asked to swallow while the imaging is performed .In some instances you may be asked to swallow barium coated marshmallows to assess the motility of a solid food bolus. The exam can be recorded as a digital or video fluoroscopy procedure. POST PROCEDURE It will take 1-2 days for the barium to pass through your system. To facilitate this, it is important, unless otherwise directed, to increase your fluids for the next 24-48hrs and to resume your normal diet.  This test  typically takes about 30 minutes to perform. __________________________________________________________________________________

## 2015-12-18 ENCOUNTER — Ambulatory Visit (HOSPITAL_COMMUNITY)
Admission: RE | Admit: 2015-12-18 | Discharge: 2015-12-18 | Disposition: A | Discharge status: Home / Self Care | Payer: BLUE CROSS/BLUE SHIELD | Source: Ambulatory Visit | Attending: Physician Assistant | Admitting: Physician Assistant

## 2015-12-18 DIAGNOSIS — K219 Gastro-esophageal reflux disease without esophagitis: Secondary | ICD-10-CM | POA: Diagnosis not present

## 2015-12-18 DIAGNOSIS — R1032 Left lower quadrant pain: Secondary | ICD-10-CM | POA: Insufficient documentation

## 2015-12-18 DIAGNOSIS — R1310 Dysphagia, unspecified: Secondary | ICD-10-CM | POA: Insufficient documentation

## 2015-12-18 DIAGNOSIS — K449 Diaphragmatic hernia without obstruction or gangrene: Secondary | ICD-10-CM | POA: Diagnosis not present

## 2015-12-18 DIAGNOSIS — G8929 Other chronic pain: Secondary | ICD-10-CM | POA: Insufficient documentation

## 2015-12-25 ENCOUNTER — Other Ambulatory Visit: Payer: Self-pay

## 2015-12-25 MED ORDER — PANTOPRAZOLE SODIUM 40 MG PO TBEC: 40.0000 mg | DELAYED_RELEASE_TABLET | Freq: Every day | ORAL | 6 refills | Status: AC

## 2016-03-28 ENCOUNTER — Telehealth: Payer: Self-pay | Admitting: Physician Assistant

## 2016-03-28 NOTE — Telephone Encounter (Signed)
Are we referring her to Pain Management? What would be her diagnosis? She has decided she wants someone to be incharge of managing the "mystery" left sided pain she has.

## 2016-03-30 NOTE — Telephone Encounter (Signed)
Yes we can refer her to Pain management - she has hx Hodgkins lymphoma in remission, multiple prior abdominal  surgeries ,including a left ureteral injury , and chronic LLQ pain

## 2016-03-31 ENCOUNTER — Other Ambulatory Visit: Payer: Self-pay

## 2016-03-31 DIAGNOSIS — Z8571 Personal history of Hodgkin lymphoma: Secondary | ICD-10-CM

## 2016-03-31 DIAGNOSIS — Z9889 Other specified postprocedural states: Secondary | ICD-10-CM

## 2016-03-31 DIAGNOSIS — R1032 Left lower quadrant pain: Secondary | ICD-10-CM

## 2016-03-31 NOTE — Telephone Encounter (Signed)
See Referrals for more information. Left the patient information on her voicemail. [facility] will contact the patient directly if the physician accepts the patient. Otherwise we will be notified. Process can take 4 to 6 weeks.

## 2016-03-31 NOTE — Telephone Encounter (Signed)
Internal referral sent to [facility].

## 2018-05-21 IMAGING — RF DG ESOPHAGUS
5 series · 12 of 15 positions shown · non-contrast
Comparison: None.

CLINICAL DATA: Dysphagia.

EXAM:
ESOPHOGRAM / BARIUM SWALLOW / BARIUM TABLET STUDY
TECHNIQUE: Combined double contrast and single contrast examination performed
using effervescent crystals, thick barium liquid, and thin barium
liquid. The patient was observed with fluoroscopy swallowing a 13 mm
barium sulphate tablet.
FLUOROSCOPY TIME:  Fluoroscopy Time:  0 minutes 54 second
Radiation Exposure Index (if provided by the fluoroscopic device):
Number of Acquired Spot Images: 0

[Series 1: cp_standard · 0.41mm/px · 3 of 123 frames shown (1 of 4)]
[frame 19/123]
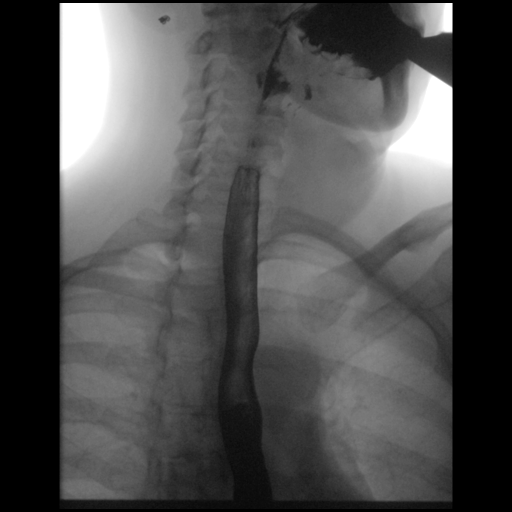
[frame 28/123]
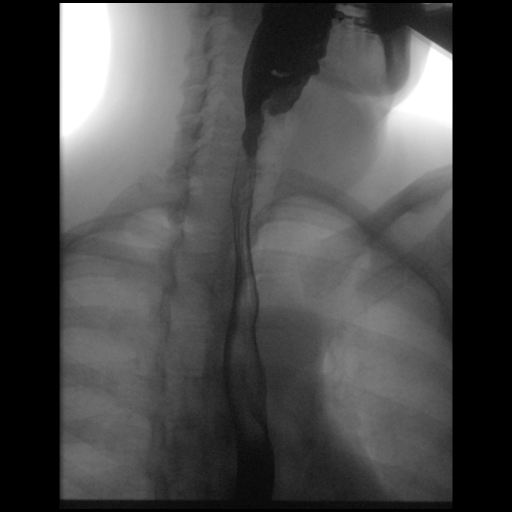
[frame 105/123]
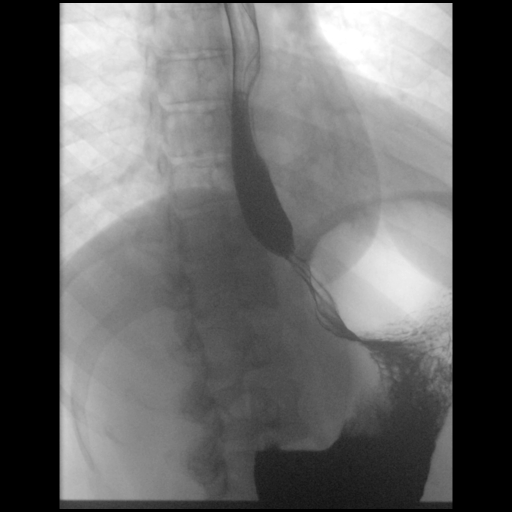

[Series 2: cp_standard · 0.41mm/px · 3 of 56 frames shown (2 of 4)]
[frame 9/56]
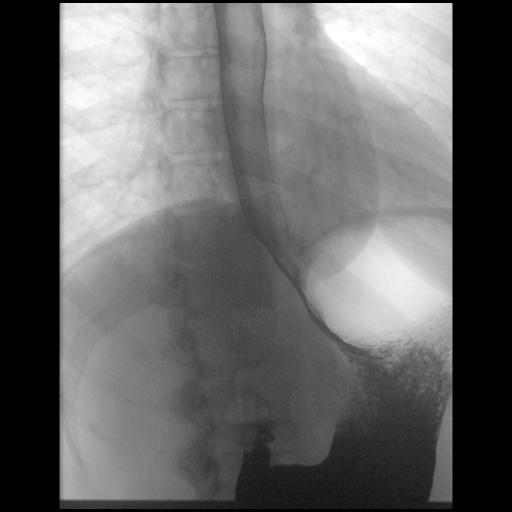
[frame 29/56]
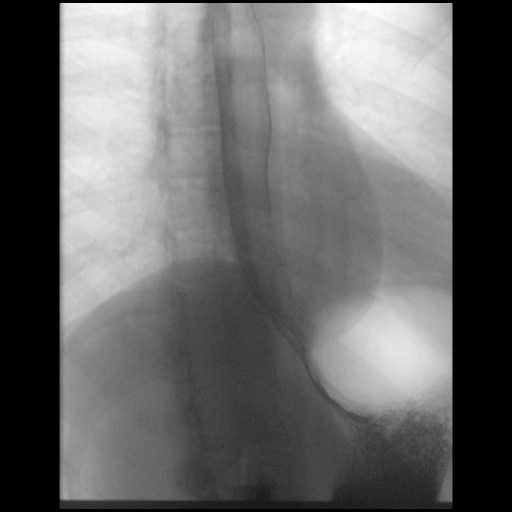
[frame 48/56]
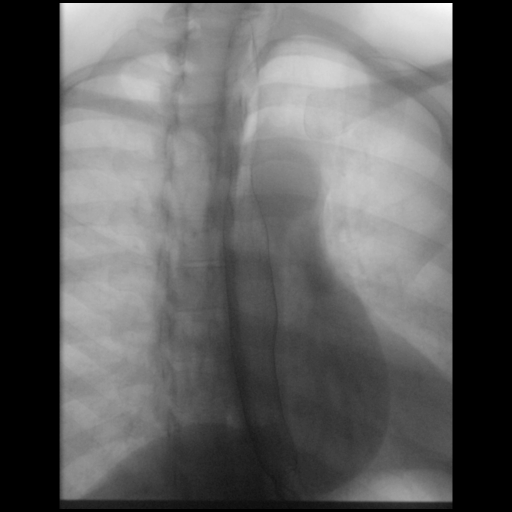

[Series 3: cp_standard · 0.38mm/px · 4 of 101 frames shown (3 of 4)]
[frame 16/101]
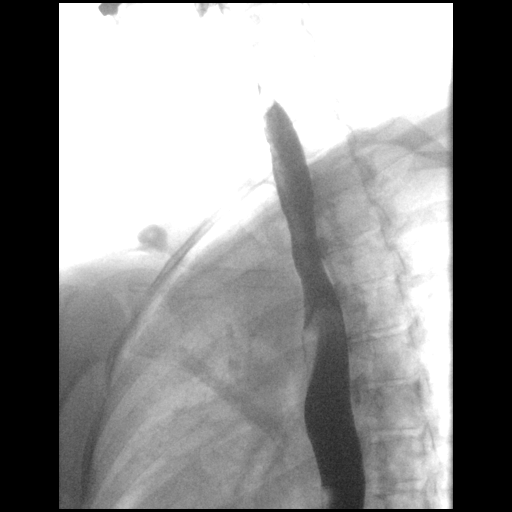
[frame 51/101]
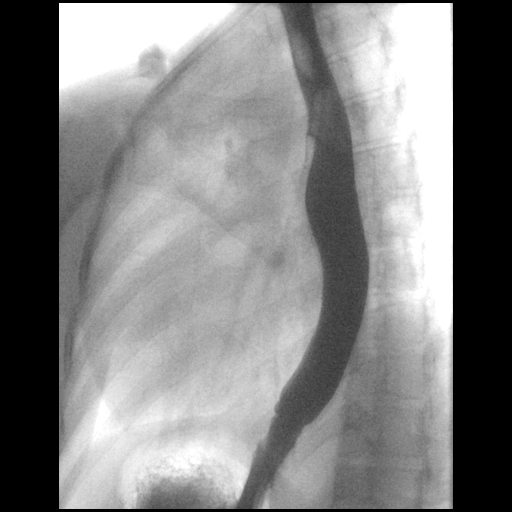
[frame 86/101]
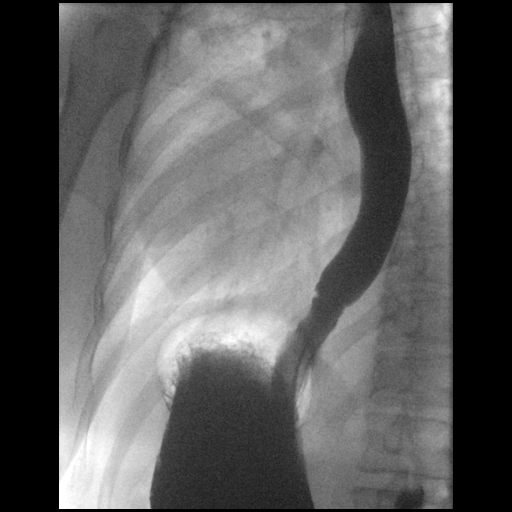
[frame 92/101]
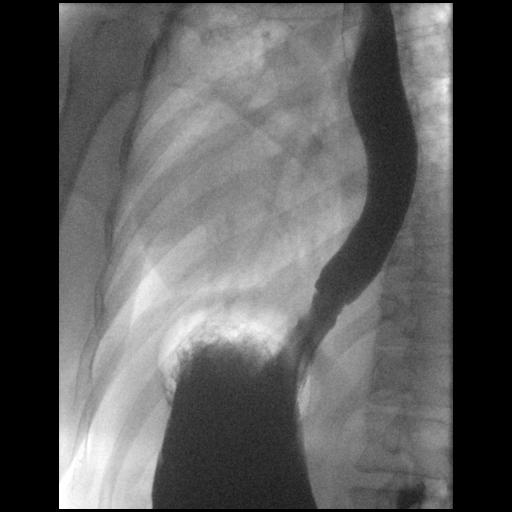

[Series 4: fluoro_barium 2fps_bw · 0.19mm/px · 1 of 2 frames shown]
[frame 2/2]
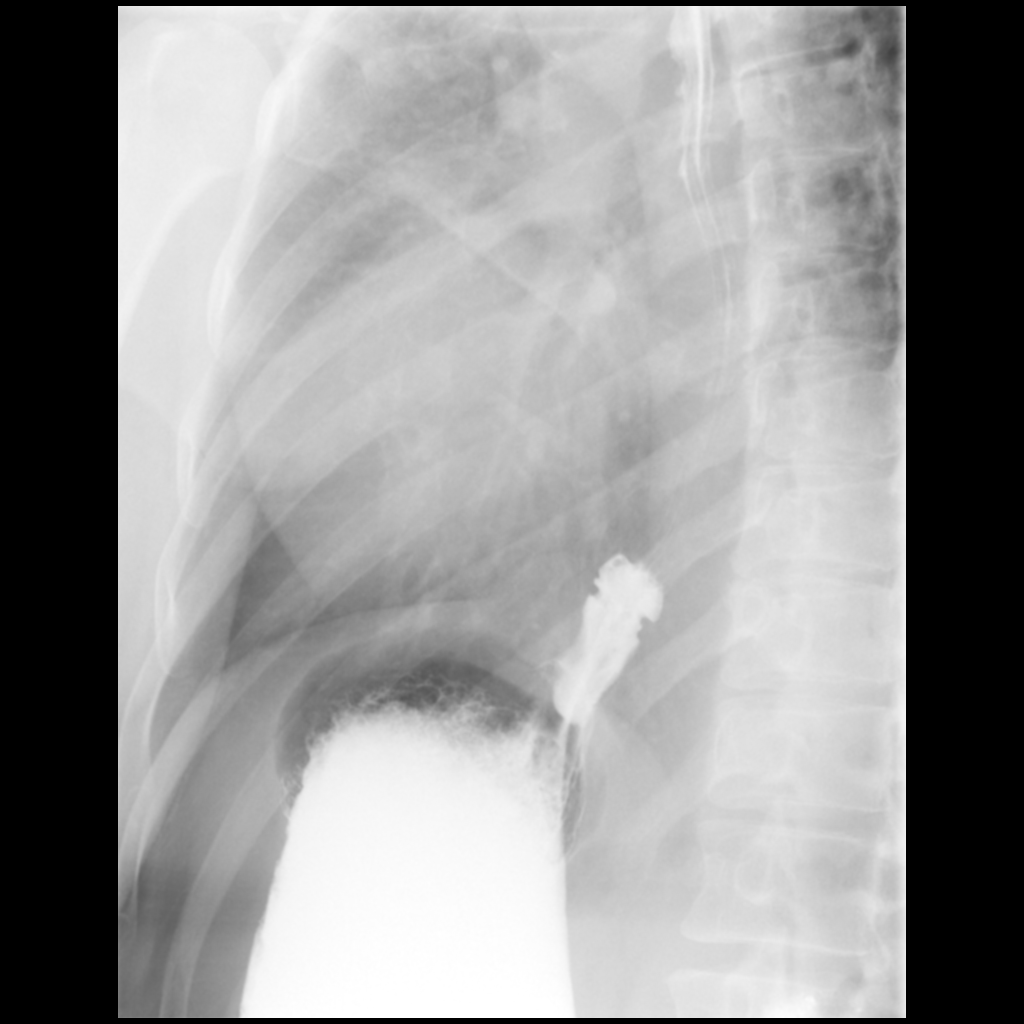

[Series 5: cp_standard · 0.19mm/px · 1 of 1 slices shown (4 of 4)]
[im 1/1]
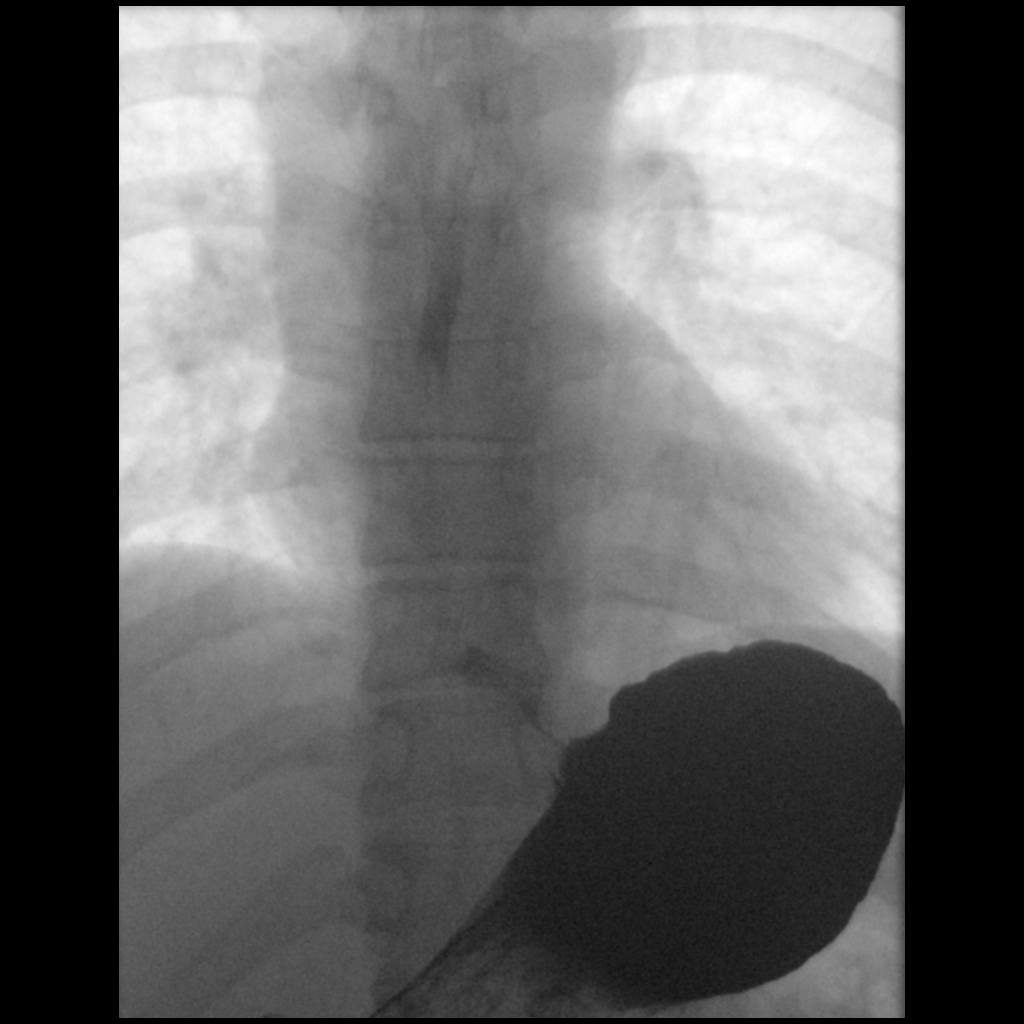

[12 of 15 positions shown; findings below may reference images not displayed]

FINDINGS: Esophageal motility normal. Small hiatal hernia with mild
gastroesophageal reflux. Smooth short-segment stricture at the GE
junction compatible with a mild benign stricture. Negative for mass
or ulcer in the esophagus.

Barium tablet passed readily into the stomach without delay.
IMPRESSION: Small hiatal hernia with mild gastroesophageal reflux. Benign
stricture at the gastroesophageal junction which did impede the
barium tablet.

## 2022-02-19 DIAGNOSIS — L509 Urticaria, unspecified: Secondary | ICD-10-CM | POA: Diagnosis not present

## 2023-09-26 ENCOUNTER — Encounter: Payer: Self-pay | Admitting: Internal Medicine
